# Patient Record
Sex: Female | Born: 1995 | Race: Black or African American | Hispanic: No | Marital: Single | Smoking: Never smoker
Health system: Southern US, Community
[De-identification: ages and names within clinical notes are randomized; demographics above are authoritative.]

---

## 2017-08-22 ENCOUNTER — Ambulatory Visit (INDEPENDENT_AMBULATORY_CARE_PROVIDER_SITE_OTHER): Payer: Managed Care, Other (non HMO) | Admitting: Certified Nurse Midwife

## 2017-08-22 ENCOUNTER — Encounter: Payer: Self-pay | Admitting: Certified Nurse Midwife

## 2017-08-22 VITALS — BP 127/70 | HR 75 | Ht 63.0 in | Wt 138.0 lb

## 2017-08-22 DIAGNOSIS — Z01419 Encounter for gynecological examination (general) (routine) without abnormal findings: Secondary | ICD-10-CM

## 2017-08-22 DIAGNOSIS — N898 Other specified noninflammatory disorders of vagina: Secondary | ICD-10-CM | POA: Diagnosis not present

## 2017-08-22 DIAGNOSIS — Z01411 Encounter for gynecological examination (general) (routine) with abnormal findings: Secondary | ICD-10-CM | POA: Diagnosis not present

## 2017-08-22 DIAGNOSIS — Z113 Encounter for screening for infections with a predominantly sexual mode of transmission: Secondary | ICD-10-CM

## 2017-08-22 DIAGNOSIS — T8384XA Pain from genitourinary prosthetic devices, implants and grafts, initial encounter: Secondary | ICD-10-CM | POA: Diagnosis not present

## 2017-08-22 NOTE — Patient Instructions (Signed)
Preventive Care 18-39 Years, Female Preventive care refers to lifestyle choices and visits with your health care provider that can promote health and wellness. What does preventive care include?  A yearly physical exam. This is also called an annual well check.  Dental exams once or twice a year.  Routine eye exams. Ask your health care provider how often you should have your eyes checked.  Personal lifestyle choices, including: ? Daily care of your teeth and gums. ? Regular physical activity. ? Eating a healthy diet. ? Avoiding tobacco and drug use. ? Limiting alcohol use. ? Practicing safe sex. ? Taking vitamin and mineral supplements as recommended by your health care provider. What happens during an annual well check? The services and screenings done by your health care provider during your annual well check will depend on your age, overall health, lifestyle risk factors, and family history of disease. Counseling Your health care provider may ask you questions about your:  Alcohol use.  Tobacco use.  Drug use.  Emotional well-being.  Home and relationship well-being.  Sexual activity.  Eating habits.  Work and work Statistician.  Method of birth control.  Menstrual cycle.  Pregnancy history.  Screening You may have the following tests or measurements:  Height, weight, and BMI.  Diabetes screening. This is done by checking your blood sugar (glucose) after you have not eaten for a while (fasting).  Blood pressure.  Lipid and cholesterol levels. These may be checked every 5 years starting at age 56.  Skin check.  Hepatitis C blood test.  Hepatitis B blood test.  Sexually transmitted disease (STD) testing.  BRCA-related cancer screening. This may be done if you have a family history of breast, ovarian, tubal, or peritoneal cancers.  Pelvic exam and Pap test. This may be done every 3 years starting at age 24. Starting at age 58, this may be done every 5  years if you have a Pap test in combination with an HPV test.  Discuss your test results, treatment options, and if necessary, the need for more tests with your health care provider. Vaccines Your health care provider may recommend certain vaccines, such as:  Influenza vaccine. This is recommended every year.  Tetanus, diphtheria, and acellular pertussis (Tdap, Td) vaccine. You may need a Td booster every 10 years.  Varicella vaccine. You may need this if you have not been vaccinated.  HPV vaccine. If you are 46 or younger, you may need three doses over 6 months.  Measles, mumps, and rubella (MMR) vaccine. You may need at least one dose of MMR. You may also need a second dose.  Pneumococcal 13-valent conjugate (PCV13) vaccine. You may need this if you have certain conditions and were not previously vaccinated.  Pneumococcal polysaccharide (PPSV23) vaccine. You may need one or two doses if you smoke cigarettes or if you have certain conditions.  Meningococcal vaccine. One dose is recommended if you are age 31-21 years and a first-year college student living in a residence hall, or if you have one of several medical conditions. You may also need additional booster doses.  Hepatitis A vaccine. You may need this if you have certain conditions or if you travel or work in places where you may be exposed to hepatitis A.  Hepatitis B vaccine. You may need this if you have certain conditions or if you travel or work in places where you may be exposed to hepatitis B.  Haemophilus influenzae type b (Hib) vaccine. You may need this if  you have certain risk factors.  Talk to your health care provider about which screenings and vaccines you need and how often you need them. This information is not intended to replace advice given to you by your health care provider. Make sure you discuss any questions you have with your health care provider. Document Released: 03/20/2001 Document Revised: 10/12/2015  Document Reviewed: 11/23/2014 Elsevier Interactive Patient Education  [employer].

## 2017-08-22 NOTE — Progress Notes (Signed)
New pt is here for an annual exam. LPS 2018. Skyla inserted 2016 and states she is having complications from it.

## 2017-08-23 ENCOUNTER — Other Ambulatory Visit: Payer: Self-pay | Admitting: Certified Nurse Midwife

## 2017-08-23 DIAGNOSIS — Z30431 Encounter for routine checking of intrauterine contraceptive device: Secondary | ICD-10-CM

## 2017-08-24 NOTE — Progress Notes (Signed)
ANNUAL PREVENTATIVE CARE GYN  ENCOUNTER NOTE  Subjective:       Martha Bates is a 22 y.o. G0P0000 female here for a routine annual gynecologic exam.  Current complaints: 1. Abdominal cramping with IUD-considerating removal 2. Vaginal dryness 3. Desires STI testing  Reports abdominal cramping with IUD for the last year. IUD was previously repositioned by previous provider. Patient states she is not always able to feel strings. Noticed intermittent vaginal dryness since IUD placement, but was not an issue at that time. In a new relationship, sexual active. Desires STI testing.   Denies difficulty breathing or respiratory distress, chest pain, abdominal pain, vaginal bleeding, dysuria, and leg pain or swelling.    Gynecologic History  Patient's last menstrual period was 08/07/2017 (exact date).  Contraception: IUD, Christean GriefSkyla 2016  Last Pap: 2018. Results were: normal  Obstetric History  OB History  Gravida Para Term Preterm AB Living  0 0 0 0 0 0  SAB TAB Ectopic Multiple Live Births  0 0 0 0 0    No past medical history on file.  Current Outpatient Medications on File Prior to Visit  Medication Sig Dispense Refill  . Levonorgestrel (SKYLA) 13.5 MG IUD by Intrauterine route.     No current facility-administered medications on file prior to visit.     No Known Allergies  Social History   Socioeconomic History  . Marital status: Single    Spouse name: Not on file  . Number of children: Not on file  . Years of education: Not on file  . Highest education level: Not on file  Occupational History  . Not on file  Social Needs  . Financial resource strain: Not on file  . Food insecurity:    Worry: Not on file    Inability: Not on file  . Transportation needs:    Medical: Not on file    Non-medical: Not on file  Tobacco Use  . Smoking status: Never Smoker  . Smokeless tobacco: Never Used  Substance and Sexual Activity  . Alcohol use: Yes    Comment: occas  . Drug  use: Never  . Sexual activity: Yes    Birth control/protection: IUD  Lifestyle  . Physical activity:    Days per week: Not on file    Minutes per session: Not on file  . Stress: Not on file  Relationships  . Social connections:    Talks on phone: Not on file    Gets together: Not on file    Attends religious service: Not on file    Active member of club or organization: Not on file    Attends meetings of clubs or organizations: Not on file    Relationship status: Not on file  . Intimate partner violence:    Fear of current or ex partner: Not on file    Emotionally abused: Not on file    Physically abused: Not on file    Forced sexual activity: Not on file  Other Topics Concern  . Not on file  Social History Narrative  . Not on file    Family History  Problem Relation Age of Onset  . Diabetes Mother     The following portions of the patient's history were reviewed and updated as appropriate: allergies, current medications, past family history, past medical history, past social history, past surgical history and problem list.  Review of Systems  ROS negative except as noted above. Information obtained from patient.    Objective:  BP 127/70   Pulse 75   Ht 5\' 3"  (1.6 m)   Wt 138 lb (62.6 kg)   LMP 08/07/2017 (Exact Date)   BMI 24.45 kg/m    CONSTITUTIONAL: Well-developed, well-nourished female in no acute distress.   PSYCHIATRIC: Normal mood and affect. Normal behavior. Normal judgment and thought content.  NEUROLGIC: Alert and oriented to person, place, and time. Normal muscle tone coordination. No cranial  nerve deficit noted.  HENT:  Normocephalic, atraumatic, External right and left ear normal.   EYES: Conjunctivae and EOM are normal. Pupils are equal and round.   NECK: Normal range of motion, supple, no masses.  Normal thyroid.   SKIN: Skin is warm and dry. No rash noted. Not diaphoretic. No erythema. No pallor.  CARDIOVASCULAR: Normal heart rate  noted, regular rhythm, no murmur.  RESPIRATORY: Clear to auscultation bilaterally. Effort and breath sounds normal, no problems with respiration noted.  BREASTS: Symmetric in size. No masses, skin changes, nipple drainage, or lymphadenopathy. Bilateral nipple rings present.   ABDOMEN: Soft, normal bowel sounds, no distention noted.  No tenderness, rebound or guarding.   PELVIC:  External Genitalia: Normal  Vagina: Normal  Cervix: Normal  Uterus: Normal  Adnexa: Normal  MUSCULOSKELETAL: Normal range of motion. No tenderness.  No cyanosis, clubbing, or edema.  2+ distal pulses.  LYMPHATIC: No Axillary, Supraclavicular, or Inguinal Adenopathy.  Assessment:   Annual gynecologic examination 22 y.o.   Contraception: Exercise/Diet/Weight control, Tobacco Warnings, Alcohol/Substance use risks, Stress Management, Peer Pressure Issues and Safe Sex   Normal BMI   Problem List Items Addressed This Visit    None    Visit Diagnoses    Well woman exam    -  Primary   Relevant Orders   NuSwab Vaginitis Plus (VG+)   Screening for STDs (sexually transmitted diseases)       Relevant Orders   NuSwab Vaginitis Plus (VG+)   Pain due to intrauterine contraceptive device (IUD), initial encounter [facility])          Plan:   IUD strings not present. Will schedule ultrasound to verify location and then remove per patient request  Pap: Not needed  Labs: NuSwab, will contact patient with results   Routine preventative health maintenance measures emphasized: Exercise/Diet/Weight control, Tobacco Warnings, Alcohol/Substance use risks, Stress Management, Peer Pressure Issues and Safe Sex; See AVS  Reviewed red flag symptoms and when to call  RTC ASAP for ultrasound to verify IUD placement and removal  RTC x 1 year for Annual Exam or sooner if needed   Gunnar Bulla, CNM [facility], [facility]

## 2017-08-26 LAB — NUSWAB VAGINITIS PLUS (VG+)
CANDIDA ALBICANS, NAA: POSITIVE — AB
CHLAMYDIA TRACHOMATIS, NAA: NEGATIVE
Candida glabrata, NAA: NEGATIVE
NEISSERIA GONORRHOEAE, NAA: NEGATIVE
Trich vag by NAA: NEGATIVE

## 2017-08-27 ENCOUNTER — Telehealth: Payer: Self-pay

## 2017-08-27 ENCOUNTER — Telehealth: Payer: Self-pay | Admitting: Certified Nurse Midwife

## 2017-08-27 NOTE — Progress Notes (Signed)
Please contact patient results. NuSwab positive for yeast infection. Review treatment options either diflucan (dispense 2, take one now and one if three days if symptoms continue, no refills) or terazo 7 vaginal cream nightly x 7 days. May have desired Rx. Encourage MyChart activation. Thanks, JML

## 2017-08-27 NOTE — Telephone Encounter (Signed)
The patient called and stated that she would like to speak with Jasmine DecemberSharon, Please advise.

## 2017-08-27 NOTE — Telephone Encounter (Signed)
Message left for pt to please return my call. 

## 2017-08-28 ENCOUNTER — Other Ambulatory Visit: Payer: Self-pay

## 2017-08-28 MED ORDER — FLUCONAZOLE 150 MG PO TABS: 150.0000 mg | ORAL_TABLET | Freq: Once | ORAL | 0 refills | Status: AC

## 2017-08-28 NOTE — Telephone Encounter (Signed)
The patient called back this morning and is requesting Jasmine DecemberSharon to call her back at her contact number.  Please advise, thanks.

## 2017-08-28 NOTE — Telephone Encounter (Signed)
Returned pts call- informed her of test results per ML. Pt chose [facility]lucan [facility] [place]

## 2017-08-30 ENCOUNTER — Ambulatory Visit (INDEPENDENT_AMBULATORY_CARE_PROVIDER_SITE_OTHER): Payer: Managed Care, Other (non HMO)

## 2017-08-30 DIAGNOSIS — Z30431 Encounter for routine checking of intrauterine contraceptive device: Secondary | ICD-10-CM | POA: Diagnosis not present

## 2017-09-05 ENCOUNTER — Ambulatory Visit: Payer: Managed Care, Other (non HMO) | Admitting: Certified Nurse Midwife

## 2017-09-13 ENCOUNTER — Encounter: Payer: Self-pay | Admitting: Certified Nurse Midwife

## 2017-09-13 ENCOUNTER — Ambulatory Visit (INDEPENDENT_AMBULATORY_CARE_PROVIDER_SITE_OTHER): Payer: Managed Care, Other (non HMO) | Admitting: Certified Nurse Midwife

## 2017-09-13 VITALS — BP 130/80 | HR 69 | Ht 61.0 in | Wt 137.3 lb

## 2017-09-13 DIAGNOSIS — Z30432 Encounter for removal of intrauterine contraceptive device: Secondary | ICD-10-CM | POA: Diagnosis not present

## 2017-09-13 MED ORDER — ETONOGESTREL-ETHINYL ESTRADIOL 0.12-0.015 MG/24HR VA RING: VAGINAL_RING | VAGINAL | 4 refills | Status: DC

## 2017-09-13 NOTE — Patient Instructions (Signed)
Ethinyl Estradiol; Etonogestrel vaginal ring What is this medicine? ETHINYL ESTRADIOL; ETONOGESTREL (ETH in il es tra DYE ole; et oh noe JES trel) vaginal ring is a flexible, vaginal ring used as a contraceptive (birth control method). This medicine combines two types of female hormones, an estrogen and a progestin. This ring is used to prevent ovulation and pregnancy. Each ring is effective for one month. This medicine may be used for other purposes; ask your health care provider or pharmacist if you have questions. COMMON BRAND NAME(S): NuvaRing What should I tell my health care provider before I take this medicine? They need to know if you have or ever had any of these conditions: -abnormal vaginal bleeding -blood vessel disease or blood clots -breast, cervical, endometrial, ovarian, liver, or uterine cancer -diabetes -gallbladder disease -heart disease or recent heart attack -high blood pressure -high cholesterol -kidney disease -liver disease -migraine headaches -stroke -systemic lupus erythematosus (SLE) -tobacco smoker -an unusual or allergic reaction to estrogens, progestins, other medicines, foods, dyes, or preservatives -pregnant or trying to get pregnant -breast-feeding How should I use this medicine? Insert the ring into your vagina as directed. Follow the directions on the prescription label. The ring will remain place for 3 weeks and is then removed for a 1-week break. A new ring is inserted 1 week after the last ring was removed, on the same day of the week. Check often to make sure the ring is still in place, especially before and after sexual intercourse. If the ring was out of the vagina for an unknown amount of time, you may not be protected from pregnancy. Perform a pregnancy test and call your doctor. Do not use more often than directed. A patient package insert for the product will be given with each prescription and refill. Read this sheet carefully each time. The  sheet may change frequently. Contact your pediatrician regarding the use of this medicine in children. Special care may be needed. This medicine has been used in female children who have started having menstrual periods. Overdosage: If you think you have taken too much of this medicine contact a poison control center or emergency room at once. NOTE: This medicine is only for you. Do not share this medicine with others. What if I miss a dose? You will need to replace your vaginal ring once a month as directed. If the ring should slip out, or if you leave it in longer or shorter than you should, contact your health care professional for advice. What may interact with this medicine? Do not take this medicine with the following medication: -dasabuvir; ombitasvir; paritaprevir; ritonavir -ombitasvir; paritaprevir; ritonavir This medicine may also interact with the following medications: -acetaminophen -antibiotics or medicines for infections, especially rifampin, rifabutin, rifapentine, and griseofulvin, and possibly penicillins or tetracyclines -aprepitant -ascorbic acid (vitamin C) -atorvastatin -barbiturate medicines, such as phenobarbital -bosentan -carbamazepine -caffeine -clofibrate -cyclosporine -dantrolene -doxercalciferol -felbamate -grapefruit juice -hydrocortisone -medicines for anxiety or sleeping problems, such as diazepam or temazepam -medicines for diabetes, including pioglitazone -modafinil -mycophenolate -nefazodone -oxcarbazepine -phenytoin -prednisolone -ritonavir or other medicines for HIV infection or AIDS -rosuvastatin -selegiline -soy isoflavones supplements -St. John's wort -tamoxifen or raloxifene -theophylline -thyroid hormones -topiramate -warfarin This list may not describe all possible interactions. Give your health care provider a list of all the medicines, herbs, non-prescription drugs, or dietary supplements you use. Also tell them if you smoke,  drink alcohol, or use illegal drugs. Some items may interact with your medicine. What should I watch for while using   this medicine? Visit your doctor or health care professional for regular checks on your progress. You will need a regular breast and pelvic exam and Pap smear while on this medicine. Use an additional method of contraception during the first cycle that you use this ring. Do not use a diaphragm or female condom, as the ring can interfere with these birth control methods and their proper placement. If you have any reason to think you are pregnant, stop using this medicine right away and contact your doctor or health care professional. If you are using this medicine for hormone related problems, it may take several cycles of use to see improvement in your condition. Smoking increases the risk of getting a blood clot or having a stroke while you are using hormonal birth control, especially if you are more than 23 years old. You are strongly advised not to smoke. This medicine can make your body retain fluid, making your fingers, hands, or ankles swell. Your blood pressure can go up. Contact your doctor or health care professional if you feel you are retaining fluid. This medicine can make you more sensitive to the sun. Keep out of the sun. If you cannot avoid being in the sun, wear protective clothing and use sunscreen. Do not use sun lamps or tanning beds/booths. If you wear contact lenses and notice visual changes, or if the lenses begin to feel uncomfortable, consult your eye care specialist. In some women, tenderness, swelling, or minor bleeding of the gums may occur. Notify your dentist if this happens. Brushing and flossing your teeth regularly may help limit this. See your dentist regularly and inform your dentist of the medicines you are taking. If you are going to have elective surgery, you may need to stop using this medicine before the surgery. Consult your health care professional  for advice. This medicine does not protect you against HIV infection (AIDS) or any other sexually transmitted diseases. What side effects may I notice from receiving this medicine? Side effects that you should report to your doctor or health care professional as soon as possible: -breast tissue changes or discharge -changes in vaginal bleeding during your period or between your periods -chest pain -coughing up blood -dizziness or fainting spells -headaches or migraines -leg, arm or groin pain -severe or sudden headaches -stomach pain (severe) -sudden shortness of breath -sudden loss of coordination, especially on one side of the body -speech problems -symptoms of vaginal infection like itching, irritation or unusual discharge -tenderness in the upper abdomen -vomiting -weakness or numbness in the arms or legs, especially on one side of the body -yellowing of the eyes or skin Side effects that usually do not require medical attention (report to your doctor or health care professional if they continue or are bothersome): -breakthrough bleeding and spotting that continues beyond the 3 initial cycles of pills -breast tenderness -mood changes, anxiety, depression, frustration, anger, or emotional outbursts -increased sensitivity to sun or ultraviolet light -nausea -skin rash, acne, or brown spots on the skin -weight gain (slight) This list may not describe all possible side effects. Call your doctor for medical advice about side effects. You may report side effects to FDA at 1-800-FDA-1088. Where should I keep my medicine? Keep out of the reach of children. Store at room temperature between 15 and 30 degrees C (59 and 86 degrees F) for up to 4 months. The product will expire after 4 months. Protect from light. Throw away any unused medicine after the expiration date. NOTE: This   sheet is a summary. It may not cover all possible information. If you have questions about this medicine, talk  to your doctor, pharmacist, or health care provider.  2018 Elsevier/Gold Standard (2015-09-30 17:00:31) Preventive Care 18-39 Years, Female Preventive care refers to lifestyle choices and visits with your health care provider that can promote health and wellness. What does preventive care include?  A yearly physical exam. This is also called an annual well check.  Dental exams once or twice a year.  Routine eye exams. Ask your health care provider how often you should have your eyes checked.  Personal lifestyle choices, including: ? Daily care of your teeth and gums. ? Regular physical activity. ? Eating a healthy diet. ? Avoiding tobacco and drug use. ? Limiting alcohol use. ? Practicing safe sex. ? Taking vitamin and mineral supplements as recommended by your health care provider. What happens during an annual well check? The services and screenings done by your health care provider during your annual well check will depend on your age, overall health, lifestyle risk factors, and family history of disease. Counseling Your health care provider may ask you questions about your:  Alcohol use.  Tobacco use.  Drug use.  Emotional well-being.  Home and relationship well-being.  Sexual activity.  Eating habits.  Work and work Statistician.  Method of birth control.  Menstrual cycle.  Pregnancy history.  Screening You may have the following tests or measurements:  Height, weight, and BMI.  Diabetes screening. This is done by checking your blood sugar (glucose) after you have not eaten for a while (fasting).  Blood pressure.  Lipid and cholesterol levels. These may be checked every 5 years starting at age 43.  Skin check.  Hepatitis C blood test.  Hepatitis B blood test.  Sexually transmitted disease (STD) testing.  BRCA-related cancer screening. This may be done if you have a family history of breast, ovarian, tubal, or peritoneal cancers.  Pelvic exam  and Pap test. This may be done every 3 years starting at age 46. Starting at age 37, this may be done every 5 years if you have a Pap test in combination with an HPV test.  Discuss your test results, treatment options, and if necessary, the need for more tests with your health care provider. Vaccines Your health care provider may recommend certain vaccines, such as:  Influenza vaccine. This is recommended every year.  Tetanus, diphtheria, and acellular pertussis (Tdap, Td) vaccine. You may need a Td booster every 10 years.  Varicella vaccine. You may need this if you have not been vaccinated.  HPV vaccine. If you are 11 or younger, you may need three doses over 6 months.  Measles, mumps, and rubella (MMR) vaccine. You may need at least one dose of MMR. You may also need a second dose.  Pneumococcal 13-valent conjugate (PCV13) vaccine. You may need this if you have certain conditions and were not previously vaccinated.  Pneumococcal polysaccharide (PPSV23) vaccine. You may need one or two doses if you smoke cigarettes or if you have certain conditions.  Meningococcal vaccine. One dose is recommended if you are age 28-21 years and a first-year college student living in a residence hall, or if you have one of several medical conditions. You may also need additional booster doses.  Hepatitis A vaccine. You may need this if you have certain conditions or if you travel or work in places where you may be exposed to hepatitis A.  Hepatitis B vaccine. You may need  this if you have certain conditions or if you travel or work in places where you may be exposed to hepatitis B.  Haemophilus influenzae type b (Hib) vaccine. You may need this if you have certain risk factors.  Talk to your health care provider about which screenings and vaccines you need and how often you need them. This information is not intended to replace advice given to you by your health care provider. Make sure you discuss any  questions you have with your health care provider. Document Released: 03/20/2001 Document Revised: 10/12/2015 Document Reviewed: 11/23/2014 Elsevier Interactive Patient Education  [employer].

## 2017-09-15 NOTE — Progress Notes (Signed)
Autumn MessingKristin Bates is a 22 y.o. year old G0P0000 African American female who presents for removal of a Skyla IUD. Her Skyla IUD was placed in 2016.   BP 130/80   Pulse 69   Ht 5\' 1"  (1.549 m)   Wt 137 lb 4.8 oz (62.3 kg)   LMP 08/19/2017   BMI 25.94 kg/m   Time out was performed.  A small plastic speculum was placed in the vagina.  The cervix was visualized, and the strings were NOT visible. An abdominal ultrasound was performed to verify IUD location. Attempts to remove device with IUD hook were unsuccessful. Via ultrasound guidance the stings were grasped and the Skyla was easily removed intact without complications by Harlow MaresMelody Shambley, CNM.   Rx: NuvaRing, see orders. Samples given.   Reviewed red flag symptoms and when to call.   RTC as needed.    Gunnar BullaJenkins Michelle Evelyn Moch, CNM [facility], [facility]

## 2017-10-13 ENCOUNTER — Encounter: Payer: Self-pay | Admitting: Emergency Medicine

## 2017-10-13 ENCOUNTER — Ambulatory Visit (INDEPENDENT_AMBULATORY_CARE_PROVIDER_SITE_OTHER)
Admission: EM | Admit: 2017-10-13 | Discharge: 2017-10-13 | Disposition: A | Discharge status: Nursing Home (Includes ICF) | Payer: Managed Care, Other (non HMO) | Source: Home / Self Care

## 2017-10-13 ENCOUNTER — Emergency Department: Payer: Managed Care, Other (non HMO)

## 2017-10-13 ENCOUNTER — Emergency Department
Admission: EM | Admit: 2017-10-13 | Discharge: 2017-10-13 | Disposition: A | Discharge status: Home / Self Care | Payer: Managed Care, Other (non HMO) | Attending: Emergency Medicine | Admitting: Emergency Medicine

## 2017-10-13 DIAGNOSIS — G4489 Other headache syndrome: Secondary | ICD-10-CM

## 2017-10-13 DIAGNOSIS — Q670 Congenital facial asymmetry: Secondary | ICD-10-CM

## 2017-10-13 DIAGNOSIS — R51 Headache: Secondary | ICD-10-CM | POA: Diagnosis present

## 2017-10-13 DIAGNOSIS — M79602 Pain in left arm: Secondary | ICD-10-CM | POA: Diagnosis not present

## 2017-10-13 DIAGNOSIS — R29810 Facial weakness: Secondary | ICD-10-CM | POA: Insufficient documentation

## 2017-10-13 LAB — COMPREHENSIVE METABOLIC PANEL
ALT: 15 U/L (ref 0–44)
AST: 24 U/L (ref 15–41)
Albumin: 4.6 g/dL (ref 3.5–5.0)
Alkaline Phosphatase: 51 U/L (ref 38–126)
Anion gap: 6 (ref 5–15)
BILIRUBIN TOTAL: 0.8 mg/dL (ref 0.3–1.2)
BUN: 10 mg/dL (ref 6–20)
CHLORIDE: 104 mmol/L (ref 98–111)
CO2: 28 mmol/L (ref 22–32)
CREATININE: 0.67 mg/dL (ref 0.44–1.00)
Calcium: 9.6 mg/dL (ref 8.9–10.3)
GFR calc Af Amer: >60 mL/min (ref >60)
GFR calc non Af Amer: >60 mL/min (ref >60)
Glucose, Bld: 107 mg/dL — ABNORMAL HIGH (ref 70–99)
Potassium: 3.5 mmol/L (ref 3.5–5.1)
Sodium: 138 mmol/L (ref 135–145)
TOTAL PROTEIN: 7.9 g/dL (ref 6.5–8.1)

## 2017-10-13 LAB — TROPONIN I

## 2017-10-13 LAB — DIFFERENTIAL
BASOS ABS: 0 10*3/uL (ref 0–0.1)
Basophils Relative: 0 %
Eosinophils Absolute: 0.1 10*3/uL (ref 0–0.7)
Eosinophils Relative: 1 %
LYMPHS ABS: 1.8 10*3/uL (ref 1.0–3.6)
Lymphocytes Relative: 27 %
MONOS PCT: 6 %
Monocytes Absolute: 0.4 10*3/uL (ref 0.2–0.9)
NEUTROS ABS: 4.4 10*3/uL (ref 1.4–6.5)
Neutrophils Relative %: 66 %

## 2017-10-13 LAB — CBC
HEMATOCRIT: 38.9 % (ref 35.0–47.0)
HEMOGLOBIN: 12.7 g/dL (ref 12.0–16.0)
MCH: 24.7 pg — ABNORMAL LOW (ref 26.0–34.0)
MCHC: 32.8 g/dL (ref 32.0–36.0)
MCV: 75.2 fL — ABNORMAL LOW (ref 80.0–100.0)
Platelets: 358 10*3/uL (ref 150–440)
RBC: 5.17 MIL/uL (ref 3.80–5.20)
RDW: 15.2 % — ABNORMAL HIGH (ref 11.5–14.5)
WBC: 6.7 10*3/uL (ref 3.6–11.0)

## 2017-10-13 LAB — PROTIME-INR
INR: 0.99
Prothrombin Time: 13 seconds (ref 11.4–15.2)

## 2017-10-13 LAB — APTT: aPTT: 28 seconds (ref 24–36)

## 2017-10-13 LAB — POCT PREGNANCY, URINE: Preg Test, Ur: NEGATIVE

## 2017-10-13 MED ORDER — SUMATRIPTAN SUCCINATE 50 MG PO TABS: 50.0000 mg | ORAL_TABLET | Freq: Once | ORAL | 0 refills | Status: DC | PRN

## 2017-10-13 MED ORDER — PROCHLORPERAZINE EDISYLATE 10 MG/2ML IJ SOLN
10.0000 mg | Freq: Once | INTRAMUSCULAR | Status: AC
  Administered 2017-10-13: 10 mg via INTRAVENOUS
  Filled 2017-10-13: qty 2

## 2017-10-13 MED ORDER — SODIUM CHLORIDE 0.9 % IV BOLUS
1000.0000 mL | Freq: Once | INTRAVENOUS | Status: AC
  Administered 2017-10-13: 1000 mL via INTRAVENOUS

## 2017-10-13 NOTE — ED Triage Notes (Signed)
Patient reports 2-3 headaches per weeks since April.  Patient states current headache started yesterday.  Patient reports blurry vision in her right eye.  Patient states she has had this symptom with headaches previously.  Patient also reports intermittent episodes or arm pain and numbness, particularly on her left side.  Patient also reports a couple of transient episodes of right sided facial droop and foot swelling.  Patient came to the ED today from [facility] due to concern regarding possible TIAs.  Patient's smile is even at this time.

## 2017-10-13 NOTE — ED Notes (Signed)
ED Provider at bedside. 

## 2017-10-13 NOTE — Discharge Instructions (Addendum)
It is not clear exactly why you are having these headaches we are reassured by your CT scan but we do strongly advise you follow-up with a neurologist in the next few days.  Return to the emergency room for any numbness or weakness or fever, change in vision or hearing difficulty speaking or other atypical symptoms.  It is very important that he follow closely with neurology.  I will send you home with Imitrex which sometimes helps people who have migraines which I suspect he may have, however, her work-up here is not enough to determine the definitively have migraines and we strongly advised that you follow-up with neurology for definitive diagnosis

## 2017-10-13 NOTE — ED Notes (Signed)
Pt ambulatory to toilet to collect urine sample.  

## 2017-10-13 NOTE — ED Provider Notes (Addendum)
[facility] Emergency Department Provider Note  ____________________________________________   I have reviewed the triage vital signs and the nursing notes. Where available I have reviewed prior notes and, if possible and indicated, outside hospital notes.    HISTORY  Chief Complaint Headache    HPI Martha Bates is a 22 y.o. female  Who is healthy, states she has had headaches off and on her whole life mostly associated with her.,  She states over the last 6 months or so, since early April, she is been having more frequent headaches to the point where she has them 3 times a week.  She denies any head trauma she denies pregnancy, she is not on any, birth control, she has no personal or family history of aneurysm, she denies illicit drug use.  Is over the last 3 weeks to a month she has been having unusual neurologic symptoms associated with this.  She states she has had some pain that goes from her pinky finger up towards her shoulder on the left side sometimes, but no weakness, she is not having that right now that she had a briefly earlier today.  She states also she has had episodes of a drooping feeling on her face just around her mouth.  She states that been coming going associated with the headaches over the last several weeks.  It was earlier today but now is resolved.  She denies any stiff neck or fever.  No hoarse voice no chest pain no exertional symptoms, patient does not use any significant caffeine.  She does work third shift overnight, she states no one else with him she works as having similar symptoms. Headaches, when they come, get a Toothaker bit of relief with naproxen or Motrin and then when she sleeps usually they go away sometimes they last for a couple days.  Nothing else makes it better or worse.  She is not actually taking any birth control anymore.  She does not use the NuvaRing.  She is sexually active denies abuse, patient denies any tickborne  exposures or recent travel.  No known carbon monoxide exposure is mostly this starts at work as she stays up all night working it [employer].  However none of her colleagues have had similar symptoms headaches are generalized usually start in the front go towards the back, sometimes a throbbing, they are associated with photophobia without vision change, and sometimes nausea.    History reviewed. No pertinent past medical history.  There are no active problems to display for this patient.   History reviewed. No pertinent surgical history.  Meds: None Allergies: Latex  Family History  Problem Relation Age of Onset  . Diabetes Mother     Social History Social History   Tobacco Use  . Smoking status: Never Smoker  . Smokeless tobacco: Never Used  Substance Use Topics  . Alcohol use: Yes    Comment: occas  . Drug use: Never    Review of Systems Constitutional: No fever/chills Eyes: No visual changes. ENT: No sore throat. No stiff neck no neck pain Cardiovascular: Denies chest pain. Respiratory: Denies shortness of breath. Gastrointestinal:   no vomiting.  No diarrhea.  No constipation. Genitourinary: Negative for dysuria. Musculoskeletal: Negative lower extremity swelling Skin: Negative for rash. Neurological: Negative for severe headaches, focal weakness or numbness.   ____________________________________________   PHYSICAL EXAM:  VITAL SIGNS: ED Triage Vitals [10/13/17 1558]  Enc Vitals Group     BP (!) 143/82     Pulse  Rate 69     Resp 16     Temp 98.7 F (37.1 C)     Temp Source Oral     SpO2 100 %     Weight 142 lb (64.4 kg)     Height 5\' 1"  (1.549 m)     Head Circumference      Peak Flow      Pain Score 5     Pain Loc      Pain Edu?      Excl. in GC?     Constitutional: Alert and oriented. Well appearing and in no acute distress. Eyes: Conjunctivae are normal Head: Atraumatic HEENT: No congestion/rhinnorhea. Mucous membranes are moist.   Oropharynx non-erythematous Neck:   Nontender with no meningismus, no masses, no stridor Cardiovascular: Normal rate, regular rhythm. Grossly normal heart sounds.  Good peripheral circulation. Respiratory: Normal respiratory effort.  No retractions. Lungs CTAB. Abdominal: Soft and nontender. No distention. No guarding no rebound Back:  There is no focal tenderness or step off.  there is no midline tenderness there are no lesions noted. there is no CVA tenderness Musculoskeletal: No lower extremity tenderness, no upper extremity tenderness. No joint effusions, no DVT signs strong distal pulses no edema Neurologic: Cranial nerves II through XII are grossly intact 5 out of 5 strength bilateral upper and lower extremity. Finger to nose within normal limits heel to shin within normal limits, speech is normal with no word finding difficulty or dysarthria, reflexes symmetric, pupils are equally round and reactive to light, there is no pronator drift, sensation is normal, vision is intact to confrontation, gait is deferred, there is no nystagmus, normal neurologic exam Skin:  Skin is warm, dry and intact. No rash noted. Psychiatric: Mood and affect are normal. Speech and behavior are normal.  ____________________________________________   LABS (all labs ordered are listed, but only abnormal results are displayed)  Labs Reviewed  CBC - Abnormal; Notable for the following components:      Result Value   MCV 75.2 (*)    MCH 24.7 (*)    RDW 15.2 (*)    All other components within normal limits  COMPREHENSIVE METABOLIC PANEL - Abnormal; Notable for the following components:   Glucose, Bld 107 (*)    All other components within normal limits  PROTIME-INR  APTT  DIFFERENTIAL  TROPONIN I  POC URINE PREG, ED  POCT PREGNANCY, URINE    Pertinent labs  results that were available during my care of the patient were reviewed by me and considered in my medical decision making (see chart for  details). ____________________________________________  EKG  I personally interpreted any EKGs ordered by me or triage Normal EKG, sinus rhythm rate 67 bpm no acute ST elevation or depression normal axis ____________________________________________  RADIOLOGY  Pertinent labs & imaging results that were available during my care of the patient were reviewed by me and considered in my medical decision making (see chart for details). If possible, patient and/or family made aware of any abnormal findings.  No results found. ____________________________________________    PROCEDURES  Procedure(s) performed: None  Procedures  Critical Care performed: None  ____________________________________________   INITIAL IMPRESSION / ASSESSMENT AND PLAN / ED COURSE  Pertinent labs & imaging results that were available during my care of the patient were reviewed by me and considered in my medical decision making (see chart for details).  With chronic recurrent headaches.  I will give her medication for the headache she has at this time.  She states this is mild.  Neurologic exam is quite reassuring.  Given her history however of headache associate with neurologic complaints I will obtain a CT as a screening test.  I have informed the patient that she most likely need an outpatient MRI if that is negative.  Given chronicity of complaint I have very low suspicion for acute process.  Specifically, there is nothing at this time to suggest CVA mass bleed or meningitis however, we will certainly image her as a precaution.  Given the nature of her headaches I do feel that she will require close follow-up.  Given the chronicity of her headaches I do not think she requires admission.  ----------------------------------------- 7:09 PM on 10/13/2017 -----------------------------------------  Patient states her headache is nearly gone and she feels much better.  Able to sleep here.  CT reassuring blood work  reassuring.   Pt remains at neurologic baseline. At this time, there is nothing to suggest or support the diagnosis of subarachnoid hemorrhage, aneurysmal event, meningitis, tumor or mass, cavernous thrombosis, encephalitis, ischemic stroke, pseudotumor cerebri, glaucoma, temporal arteritis, or any other acute intracrania/neurological process. Extensive return precautions including but not limited to any new or worrisome symptoms such as worsening of, or change in, headache, any neurological symptoms, fever etc.. Natural disease course discussed with patient. The need for follow-up and all of my customary return precautions have been discussed as well.    ____________________________________________   FINAL CLINICAL IMPRESSION(S) / ED DIAGNOSES  Final diagnoses:  None      This chart was dictated using voice recognition software.  Despite best efforts to proofread,  errors can occur which can change meaning.       Jeanmarie Plant, MD 10/13/17 Bennie Hind    Jeanmarie Plant, MD 10/13/17 [phone]

## 2017-10-13 NOTE — ED Notes (Signed)
Pt transported to CT ?

## 2017-10-13 NOTE — ED Triage Notes (Signed)
For the past few weeks see has been having headache with vision changes, feet swelling, numbness and tingling radiating up her arm and said a few days ago she was having facial numbness on the right side along with dropping. Right eye blurry currently with her headache and right thumb twitching.

## 2017-10-13 NOTE — ED Provider Notes (Signed)
[facility]    CSN: [id number] Arrival date & time: 10/13/17  1339     History   Chief Complaint Chief Complaint  Patient presents with  . Headache    HPI Rosaria Kubin is a 22 y.o. female.   HPI  22 year old female presents stating that she is had headaches beginning in April.  She has experienced a visual changes with the headaches since that time she has noticed that the headaches have progressed in frequency and severity.  She states a few days ago she was having facial numbness on the right side of her face with drooping of the right side of her face.  Had numbness and tingling radiating up her arm with feet swelling.  Has not been evaluated by anyone so far.  Currently she has a headache with right eye blurriness.  She also has some twitching of her right thumb.  Pressures 142/92.  During employee health check at [employer] she had a blood pressure of 128/90.  Not taking birth control pills had an IUD for 4 years which was removed and she is on a nova ring but has not been using it recently.  Alert and oriented and in no distress at the present time         History reviewed. No pertinent past medical history.  There are no active problems to display for this patient.   History reviewed. No pertinent surgical history.  OB History    Gravida  0   Para  0   Term  0   Preterm  0   AB  0   Living  0     SAB  0   TAB  0   Ectopic  0   Multiple  0   Live Births  0            Home Medications    Prior to Admission medications   Medication Sig Start Date End Date Taking? Authorizing Provider  etonogestrel-ethinyl estradiol (NUVARING) 0.12-0.015 MG/24HR vaginal ring Insert vaginally and leave in place for 3 consecutive weeks, then remove for 1 week. 09/13/17  Yes Lawhorn, Vanessa Inwood, CNM  Levonorgestrel (SKYLA) 13.5 MG IUD by Intrauterine route.    [provider]    Family History Family History  Problem Relation Age of  Onset  . Diabetes Mother     Social History Social History   Tobacco Use  . Smoking status: Never Smoker  . Smokeless tobacco: Never Used  Substance Use Topics  . Alcohol use: Yes    Comment: occas  . Drug use: Never     Allergies   Latex   Review of Systems Review of Systems  Constitutional: Positive for activity change. Negative for appetite change, chills, fatigue and fever.  Neurological: Positive for tremors, facial asymmetry, weakness, numbness and headaches.  All other systems reviewed and are negative.    Physical Exam Triage Vital Signs ED Triage Vitals  Enc Vitals Group     BP 10/13/17 1407 (!) 142/92     Pulse Rate 10/13/17 1407 96     Resp 10/13/17 1407 18     Temp 10/13/17 1407 98.6 F (37 C)     Temp Source 10/13/17 1407 Oral     SpO2 10/13/17 1407 100 %     Weight 10/13/17 1409 142 lb (64.4 kg)     Height --      Head Circumference --      Peak Flow --  Pain Score 10/13/17 1409 5     Pain Loc --      Pain Edu? --      Excl. in GC? --    No data found.  Updated Vital Signs BP (!) 142/92   Pulse 96   Temp 98.6 F (37 C) (Oral)   Resp 18   Wt 142 lb (64.4 kg)   LMP 10/10/2017 (Exact Date) Comment: nuvaring  SpO2 100%   BMI 26.83 kg/m   Visual Acuity Right Eye Distance:   Left Eye Distance:   Bilateral Distance:    Right Eye Near:   Left Eye Near:    Bilateral Near:     Physical Exam  Constitutional: She is oriented to person, place, and time. She appears well-developed and well-nourished.  Non-toxic appearance. She does not appear ill. No distress.  HENT:  Head: Normocephalic.  Musculoskeletal: Normal range of motion.  Neurological: She is alert and oriented to person, place, and time.  Skin: Skin is warm and dry.  Psychiatric: She has a normal mood and affect. Her behavior is normal.  Nursing note and vitals reviewed.    UC Treatments / Results  Labs (all labs ordered are listed, but only abnormal results are  displayed) Labs Reviewed - No data to display  EKG None  Radiology No results found.  Procedures Procedures (including critical care time)  Medications Ordered in UC Medications - No data to display  Initial Impression / Assessment and Plan / UC Course  I have reviewed the triage vital signs and the nursing notes.  Pertinent labs & imaging results that were available during my care of the patient were reviewed by me and considered in my medical decision making (see chart for details).     Discussed with the patient her symptoms and my concerns for the recent worsening of  the facial numbness with drooping and the numbness in her right upper extremity.  I told her she would need to have a higher level of care than what I can provide here.  To have more thorough investigation.  Recommended she go to the emergency room today.  He agreed and she is going to go [facility].  She is in stable condition at the present time and will drive herself.  Spoke with Tobi Bastos the triage nurse to alert her of the patient's condition and her arrival Final Clinical Impressions(s) / UC Diagnoses   Final diagnoses:  Other headache syndrome  Facial asymmetry   Discharge Instructions   None    ED Prescriptions    None     Controlled Substance Prescriptions [place] Controlled Substance Registry consulted? Not Applicable   Lutricia Feil, PA-C 10/13/17 1694

## 2017-10-23 DIAGNOSIS — G43009 Migraine without aura, not intractable, without status migrainosus: Secondary | ICD-10-CM | POA: Insufficient documentation

## 2018-03-12 ENCOUNTER — Encounter: Payer: Self-pay | Admitting: Emergency Medicine

## 2018-03-12 ENCOUNTER — Ambulatory Visit
Admission: EM | Admit: 2018-03-12 | Discharge: 2018-03-12 | Disposition: A | Discharge status: Home / Self Care | Payer: Managed Care, Other (non HMO) | Attending: Family Medicine | Admitting: Family Medicine

## 2018-03-12 ENCOUNTER — Other Ambulatory Visit: Payer: Self-pay

## 2018-03-12 DIAGNOSIS — B9789 Other viral agents as the cause of diseases classified elsewhere: Secondary | ICD-10-CM | POA: Diagnosis not present

## 2018-03-12 DIAGNOSIS — J069 Acute upper respiratory infection, unspecified: Secondary | ICD-10-CM | POA: Diagnosis not present

## 2018-03-12 LAB — RAPID STREP SCREEN (MED CTR MEBANE ONLY): Streptococcus, Group A Screen (Direct): NEGATIVE

## 2018-03-12 MED ORDER — HYDROCOD POLST-CPM POLST ER 10-8 MG/5ML PO SUER: 5.0000 mL | Freq: Two times a day (BID) | ORAL | 0 refills | Status: DC | PRN

## 2018-03-12 NOTE — Discharge Instructions (Signed)
Rest, fluids, tylenol/ibuprofen °

## 2018-03-12 NOTE — ED Triage Notes (Signed)
Patient c/o sore throat, headache, and bodyaches that started on Sunday.

## 2018-03-12 NOTE — ED Provider Notes (Signed)
[facility]    CSN: [id number] Arrival date & time: 03/12/18  1226     History   Chief Complaint Chief Complaint  Patient presents with  . Sore Throat    APPOINTMENT  . Generalized Body Aches  . Headache    HPI Martha Bates is a 23 y.o. female.   The history is provided by the patient.  Sore Throat   Headache  Associated symptoms: congestion, myalgias, sore throat and URI   URI  Presenting symptoms: congestion, rhinorrhea and sore throat   Onset quality:  Sudden Duration:  5 days Timing:  Constant Chronicity:  New Relieved by:  None tried Ineffective treatments:  None tried Associated symptoms: myalgias   Associated symptoms: no sinus pain and no wheezing   Risk factors: sick contacts     History reviewed. No pertinent past medical history.  There are no active problems to display for this patient.   History reviewed. No pertinent surgical history.  OB History    Gravida  0   Para  0   Term  0   Preterm  0   AB  0   Living  0     SAB  0   TAB  0   Ectopic  0   Multiple  0   Live Births  0            Home Medications    Prior to Admission medications   Medication Sig Start Date End Date Taking? Authorizing Provider  SUMAtriptan (IMITREX) 50 MG tablet Take 1 tablet (50 mg total) by mouth once as needed for migraine. May repeat in 2 hours if headache persists or recurs. 10/13/17 10/14/18 Yes McShane, Rudy Jew, MD  chlorpheniramine-HYDROcodone (TUSSIONEX PENNKINETIC ER) 10-8 MG/5ML SUER Take 5 mLs by mouth every 12 (twelve) hours as needed. 03/12/18   Payton Mccallum, MD  etonogestrel-ethinyl estradiol (NUVARING) 0.12-0.015 MG/24HR vaginal ring Insert vaginally and leave in place for 3 consecutive weeks, then remove for 1 week. 09/13/17   Gunnar Bulla, CNM  Levonorgestrel (SKYLA) 13.5 MG IUD by Intrauterine route.    [provider]    Family History Family History  Problem Relation Age of Onset  . Diabetes  Mother     Social History Social History   Tobacco Use  . Smoking status: Never Smoker  . Smokeless tobacco: Never Used  Substance Use Topics  . Alcohol use: Yes    Comment: occas  . Drug use: Never     Allergies   Latex   Review of Systems Review of Systems  HENT: Positive for congestion, rhinorrhea and sore throat. Negative for sinus pain.   Respiratory: Negative for wheezing.   Musculoskeletal: Positive for myalgias.     Physical Exam Triage Vital Signs ED Triage Vitals  Enc Vitals Group     BP 03/12/18 1252 113/85     Pulse Rate 03/12/18 1252 84     Resp 03/12/18 1252 14     Temp 03/12/18 1252 98.7 F (37.1 C)     Temp Source 03/12/18 1252 Oral     SpO2 03/12/18 1252 100 %     Weight 03/12/18 1249 140 lb (63.5 kg)     Height 03/12/18 1249 5\' 1"  (1.549 m)     Head Circumference --      Peak Flow --      Pain Score 03/12/18 1249 8     Pain Loc --      Pain Edu? --  Excl. in GC? --    No data found.  Updated Vital Signs BP 113/85 (BP Location: Left Arm)   Pulse 84   Temp 98.7 F (37.1 C) (Oral)   Resp 14   Ht 5\' 1"  (1.549 m)   Wt 63.5 kg   LMP 02/19/2018 (Approximate)   SpO2 100%   BMI 26.45 kg/m   Visual Acuity Right Eye Distance:   Left Eye Distance:   Bilateral Distance:    Right Eye Near:   Left Eye Near:    Bilateral Near:     Physical Exam Vitals signs and nursing note reviewed.  Constitutional:      General: She is not in acute distress.    Appearance: She is well-developed. She is not toxic-appearing or diaphoretic.  HENT:     Head: Normocephalic and atraumatic.     Right Ear: Tympanic membrane, ear canal and external ear normal.     Left Ear: Tympanic membrane, ear canal and external ear normal.     Nose: Rhinorrhea present.     Mouth/Throat:     Pharynx: Uvula midline. No oropharyngeal exudate.  Eyes:     General: No scleral icterus.       Right eye: No discharge.        Left eye: No discharge.  Neck:      Musculoskeletal: Normal range of motion and neck supple.     Thyroid: No thyromegaly.  Cardiovascular:     Rate and Rhythm: Normal rate and regular rhythm.     Heart sounds: Normal heart sounds.  Pulmonary:     Effort: Pulmonary effort is normal. No respiratory distress.     Breath sounds: Normal breath sounds. No stridor. No wheezing, rhonchi or rales.  Lymphadenopathy:     Cervical: No cervical adenopathy.      UC Treatments / Results  Labs (all labs ordered are listed, but only abnormal results are displayed) Labs Reviewed  RAPID STREP SCREEN ([facility] ONLY)  CULTURE, GROUP A STREP [facility])    EKG None  Radiology No results found.  Procedures Procedures (including critical care time)  Medications Ordered in UC Medications - No data to display  Initial Impression / Assessment and Plan / UC Course  I have reviewed the triage vital signs and the nursing notes.  Pertinent labs & imaging results that were available during my care of the patient were reviewed by me and considered in my medical decision making (see chart for details).      Final Clinical Impressions(s) / UC Diagnoses   Final diagnoses:  Viral URI with cough     Discharge Instructions     Rest, fluids, tylenol/ibuprofen    ED Prescriptions    Medication Sig Dispense Auth. Provider   chlorpheniramine-HYDROcodone (TUSSIONEX PENNKINETIC ER) 10-8 MG/5ML SUER Take 5 mLs by mouth every 12 (twelve) hours as needed. 60 mL Payton Mccallum, MD     1. Lab results and diagnosis reviewed with patient 2. rx as per orders above; reviewed possible side effects, interactions, risks and benefits  3. Recommend supportive treatment as above 4. Follow-up prn if symptoms worsen or don't improve Controlled Substance Prescriptions [place] Controlled Substance Registry consulted? Not Applicable   Payton Mccallum, MD 03/12/18 1332

## 2018-03-14 LAB — CULTURE, GROUP A STREP (THRC)

## 2018-05-20 ENCOUNTER — Telehealth: Payer: Self-pay | Admitting: Certified Nurse Midwife

## 2018-05-20 NOTE — Telephone Encounter (Signed)
LVM for pt to call office back and schedule tele-visit. Thank you.

## 2018-05-20 NOTE — Telephone Encounter (Signed)
The patient sent a MyChart message stating she has the following issues.   "Nipple leakage, breast, and chest pain. Switching birth control"  The patient did not disclose any other information, Would you like for me to schedule a tel-visit or an office appointment? Please advise.

## 2018-05-22 ENCOUNTER — Other Ambulatory Visit: Payer: Self-pay

## 2018-05-22 ENCOUNTER — Ambulatory Visit (INDEPENDENT_AMBULATORY_CARE_PROVIDER_SITE_OTHER): Payer: Managed Care, Other (non HMO) | Admitting: Certified Nurse Midwife

## 2018-05-22 DIAGNOSIS — N643 Galactorrhea not associated with childbirth: Secondary | ICD-10-CM

## 2018-05-22 MED ORDER — NORETHIN-ETH ESTRAD-FE BIPHAS 1 MG-10 MCG / 10 MCG PO TABS: 1.0000 | ORAL_TABLET | Freq: Every day | ORAL | 4 refills | Status: AC

## 2018-05-22 NOTE — Progress Notes (Signed)
Received transferred call from [facility]- Pt provided 2 identifiers. Pt is c/o nipple "leakage and tenderness." Pt is not using birth control. LMP 05/16/18. Call transferred to [facility]

## 2018-05-22 NOTE — Progress Notes (Signed)
Virtual Visit via Telephone Note  I connected with Martha Bates on 05/22/18 at 2:57 PM by telephone and verified that I am speaking with the correct person using two identifiers.   I discussed the limitations, risks, security and privacy concerns of performing an evaluation and management service by telephone and the availability of in person appointments. I also discussed with the patient that there may be a patient responsible charge related to this service. The patient expressed understanding and agreed to proceed.   History of Present Illness:  Reports leakage of nipple since 05/12/2018. Fluid is clear to cloudy whitish color; no blood. Started with right breast and moved to left breast after five (5) to six (6) days.   Stopped using NuvaRing months ago. Not using anything for pregnancy prevention. Wishes to restart Lo Loestrin.   LMP: 05/16/2018 Period Cycle (Days): 28 Period Duration (Days): Three (3) Period Pattern: Regular Menstrual Flow: Heavy Menstrual Control: Maxi pad, Tampon Menstrual Control Change Freq (Hours): Eight (8) to 12  Dysmenorrhea: (!) Severe Dysmenorrhea Symptoms: Cramping, Other (Comment)(Breast pain and reflux)-decreased with Aleve PO  Observations/Objective:  Denies difficulty breathing or respiratory distress, chest pain, abdominal pain, excessive vaginal bleeding, dysuria, and leg pain or swelling.   Assessment and Plan:  Galactorrhea-labs, see orders; education regarding home treatment options  Initiation OCP-Rx Lo Loestrin, see orders; advised to start after next menses  Follow Up Instructions:    I discussed the assessment and treatment plan with the patient. The patient was provided an opportunity to ask questions and all were answered. The patient agreed with the plan and demonstrated an understanding of the instructions.   The patient was advised to call back or seek an in-person evaluation if the symptoms worsen or if the condition fails  to improve as anticipated.  I provided 10 minutes of non-face-to-face time during this encounter.   Gunnar Bulla, CNM [facility], [facility]

## 2018-05-22 NOTE — Patient Instructions (Addendum)
Galactorrhea Galactorrhea is an abnormal milky discharge from the breast. The discharge may come from one or both nipples. The fluid is often white, yellow, or green. It is different from the normal milk produced in nursing mothers. Galactorrhea usually occurs in women, but it can sometimes affect men. Various things can cause galactorrhea, such as:  Irritation of the breast, which can result from injury, stimulation during sexual activity, or clothes rubbing against the nipple.  Medicines.  Changes in hormone levels. In many cases, galactorrhea will go away without treatment. However, galactorrhea can also be a sign of something more serious, such as diseases of the kidney or thyroid, or problems with the pituitary gland. Your health care provider may do various tests to help determine the cause. Sometimes the cause is unknown. It is important to monitor your condition to make sure that it goes away. Follow these instructions at home:  Breast care  Watch your condition for any changes.  Do not squeeze your breasts or nipples.  Avoid breast stimulation during sexual activity.  Perform a breast self-exam once a month. Doing this more often can irritate your breasts.  Avoid clothes that rub on your nipples.  Use breast pads to absorb the discharge.  Wear a breast binder or a support bra to help prevent clothes from rubbing on your nipples. General instructions  Take over-the-counter and prescription medicines only as told by your health care provider.  Keep all follow-up visits as told by your health care provider. This is important. Contact a health care provider if you:  Develop hot flashes, vaginal dryness, or a lack of sexual desire.  Stop having menstrual periods, or they are irregular or far apart.  Have headaches.  Have vision problems. Get help right away if you:  Have breast discharge that is bloody or pus-like.  Have breast pain.  Feel a lump in your breast.   Have wrinkling or dimpling on your breast.  Notice that your breast becomes red and swollen. Summary  Galactorrhea is an abnormal milky discharge from the breast. The fluid may come from one or both nipples and is often white, yellow, or green.  Galactorrhea may be caused by various things, such as irritation of the nipples, medicines, or changes in hormone levels.  Galactorrhea often goes away without treatment. However, it also may be a sign of something more serious, such as diseases of the kidney or thyroid, or problems with the pituitary gland.  Get help right away if you have discharge that is bloody or pus-like, if you have breast pain or a lump, or if you have skin changes on your breast. This information is not intended to replace advice given to you by your health care provider. Make sure you discuss any questions you have with your health care provider. Document Released: 03/01/2004 Document Revised: 12/19/2016 Document Reviewed: 12/19/2016 Elsevier Interactive Patient Education  2019 Elsevier Inc. Ethinyl Estradiol; Norethindrone Acetate; Ferrous fumarate tablets or capsules What is this medicine? ETHINYL ESTRADIOL; NORETHINDRONE ACETATE; FERROUS FUMARATE (ETH in il es tra DYE ole; nor eth IN drone AS e tate; FER [place] FUE ma rate) is an oral contraceptive. The products combine two types of female hormones, an estrogen and a progestin. They are used to prevent ovulation and pregnancy. Some products are also used to treat acne in females. This medicine may be used for other purposes; ask your health care provider or pharmacist if you have questions. COMMON BRAND NAME(S): Aurovela 24 Fe 1/20, Aurovela Fe, Blisovi  [address] Fe, Blisovi Fe, Estrostep Fe, Gildess [address], Gildess Fe 1.5/30, Gildess Fe 1/20, Hailey 24 Fe, Junel Fe 1.5/30, Junel Fe 1/20, Junel Fe 24, Larin Fe, Lo Loestrin Fe, Loestrin 24 Fe, Loestrin FE 1.5/30, Loestrin FE 1/20, Lomedia 24 Fe, Microgestin 24 Fe, Microgestin Fe 1.5/30,  Microgestin Fe 1/20, Tarina 24 Fe, Tarina Fe 1/20, Taytulla, Tilia Fe, Tri-Legest Fe What should I tell my health care provider before I take this medicine? They need to know if you have any of these conditions: -abnormal vaginal bleeding -blood vessel disease -breast, cervical, endometrial, ovarian, liver, or uterine cancer -diabetes -gallbladder disease -heart disease or recent heart attack -high blood pressure -high cholesterol -history of blood clots -kidney disease -liver disease -migraine headaches -smoke tobacco -stroke -systemic lupus erythematosus (SLE) -an unusual or allergic reaction to estrogens, progestins, other medicines, foods, dyes, or preservatives -pregnant or trying to get pregnant -breast-feeding How should I use this medicine? Take this medicine by mouth. To reduce nausea, this medicine may be taken with food. Follow the directions on the prescription label. Take this medicine at the same time each day and in the order directed on the package. Do not take your medicine more often than directed. A patient package insert for the product will be given with each prescription and refill. Read this sheet carefully each time. The sheet may change frequently. Contact your pediatrician regarding the use of this medicine in children. Special care may be needed. This medicine has been used in female children who have started having menstrual periods. Overdosage: If you think you have taken too much of this medicine contact a poison control center or emergency room at once. NOTE: This medicine is only for you. Do not share this medicine with others. What if I miss a dose? If you miss a dose, refer to the patient information sheet you received with your medicine for direction. If you miss more than one pill, this medicine may not be as effective and you may need to use another form of birth control. What may interact with this medicine? Do not take this medicine with the  following medication: -dasabuvir; ombitasvir; paritaprevir; ritonavir -ombitasvir; paritaprevir; ritonavir This medicine may also interact with the following medications: -acetaminophen -antibiotics or medicines for infections, especially rifampin, rifabutin, rifapentine, and griseofulvin, and possibly penicillins or tetracyclines -aprepitant -ascorbic acid (vitamin C) -atorvastatin -barbiturate medicines, such as phenobarbital -bosentan -carbamazepine -caffeine -clofibrate -cyclosporine -dantrolene -doxercalciferol -felbamate -grapefruit juice -hydrocortisone -medicines for anxiety or sleeping problems, such as diazepam or temazepam -medicines for diabetes, including pioglitazone -mineral oil -modafinil -mycophenolate -nefazodone -oxcarbazepine -phenytoin -prednisolone -ritonavir or other medicines for HIV infection or AIDS -rosuvastatin -selegiline -soy isoflavones supplements -St. John's wort -tamoxifen or raloxifene -theophylline -thyroid hormones -topiramate -warfarin This list may not describe all possible interactions. Give your health care provider a list of all the medicines, herbs, non-prescription drugs, or dietary supplements you use. Also tell them if you smoke, drink alcohol, or use illegal drugs. Some items may interact with your medicine. What should I watch for while using this medicine? Visit your doctor or health care professional for regular checks on your progress. You will need a regular breast and pelvic exam and Pap smear while on this medicine. Use an additional method of contraception during the first cycle that you take these tablets. If you have any reason to think you are pregnant, stop taking this medicine right away and contact your doctor or health care professional. If you are taking this medicine for  hormone related problems, it may take several cycles of use to see improvement in your condition. Smoking increases the risk of getting a  blood clot or having a stroke while you are taking birth control pills, especially if you are more than 24 years old. You are strongly advised not to smoke. This medicine can make your body retain fluid, making your fingers, hands, or ankles swell. Your blood pressure can go up. Contact your doctor or health care professional if you feel you are retaining fluid. This medicine can make you more sensitive to the sun. Keep out of the sun. If you cannot avoid being in the sun, wear protective clothing and use sunscreen. Do not use sun lamps or tanning beds/booths. If you wear contact lenses and notice visual changes, or if the lenses begin to feel uncomfortable, consult your eye care specialist. In some women, tenderness, swelling, or minor bleeding of the gums may occur. Notify your dentist if this happens. Brushing and flossing your teeth regularly may help limit this. See your dentist regularly and inform your dentist of the medicines you are taking. If you are going to have elective surgery, you may need to stop taking this medicine before the surgery. Consult your health care professional for advice. This medicine does not protect you against HIV infection (AIDS) or any other sexually transmitted diseases. What side effects may I notice from receiving this medicine? Side effects that you should report to your doctor or health care professional as soon as possible: -allergic reactions like skin rash, itching or hives, swelling of the face, lips, or tongue -breast tissue changes or discharge -changes in vaginal bleeding during your period or between your periods -changes in vision -chest pain -confusion -coughing up blood -dizziness -feeling faint or lightheaded -headaches or migraines -leg, arm or groin pain -loss of balance or coordination -severe or sudden headaches -stomach pain (severe) -sudden shortness of breath -sudden numbness or weakness of the face, arm or leg -symptoms of vaginal  infection like itching, irritation or unusual discharge -tenderness in the upper abdomen -trouble speaking or understanding -vomiting -yellowing of the eyes or skin Side effects that usually do not require medical attention (report to your doctor or health care professional if they continue or are bothersome): -breakthrough bleeding and spotting that continues beyond the 3 initial cycles of pills -breast tenderness -mood changes, anxiety, depression, frustration, anger, or emotional outbursts -increased sensitivity to sun or ultraviolet light -nausea -skin rash, acne, or brown spots on the skin -weight gain (slight) This list may not describe all possible side effects. Call your doctor for medical advice about side effects. You may report side effects to FDA at 1-800-FDA-1088. Where should I keep my medicine? Keep out of the reach of children. Store at room temperature between 15 and 30 degrees C (59 and 86 degrees F). Throw away any unused medicine after the expiration date. NOTE: This sheet is a summary. It may not cover all possible information. If you have questions about this medicine, talk to your doctor, pharmacist, or health care provider.  2019 Elsevier/Gold Standard (2015-10-03 08:04:41)

## 2018-05-23 ENCOUNTER — Telehealth: Payer: Self-pay

## 2018-05-23 NOTE — Telephone Encounter (Signed)
Coronavirus (COVID-19) Are you at risk?  Are you at risk for the Coronavirus (COVID-19)?  To be considered HIGH RISK for Coronavirus (COVID-19), you have to meet the following criteria:  . Traveled to [place], [place], [place], [place] or [place]; or in the [place] to [place], [place], [place], or [place]; and have fever, cough, and shortness of breath within the last 2 weeks of travel OR . Been in close contact with a person diagnosed with COVID-19 within the last 2 weeks and have fever, cough, and shortness of breath . IF YOU DO NOT MEET THESE CRITERIA, YOU ARE CONSIDERED LOW RISK FOR COVID-19.  What to do if you are HIGH RISK for COVID-19?  . If you are having a medical emergency, call 911. . Seek medical care right away. Before you go to a doctor's office, urgent care or emergency department, call ahead and tell them about your recent travel, contact with someone diagnosed with COVID-19, and your symptoms. You should receive instructions from your physician's office regarding next steps of care.  . When you arrive at healthcare provider, tell the healthcare staff immediately you have returned from visiting [place], [place], [place], [place] or [place]; or traveled in the [place] to [place], [place], [place], or [place]; in the last two weeks or you have been in close contact with a person diagnosed with COVID-19 in the last 2 weeks.   . Tell the health care staff about your symptoms: fever, cough and shortness of breath. . After you have been seen by a medical provider, you will be either: o Tested for (COVID-19) and discharged home on quarantine except to seek medical care if symptoms worsen, and asked to  - Stay home and avoid contact with others until you get your results (4-5 days)  - Avoid travel on public transportation if possible (such as bus, train, or airplane) or o Sent to the Emergency Department by EMS for evaluation, COVID-19 testing, and possible  admission depending on your condition and test results.  What to do if you are LOW RISK for COVID-19?  Reduce your risk of any infection by using the same precautions used for avoiding the common cold or flu:  . Wash your hands often with soap and warm water for at least 20 seconds.  If soap and water are not readily available, use an alcohol-based hand sanitizer with at least 60% alcohol.  . If coughing or sneezing, cover your mouth and nose by coughing or sneezing into the elbow areas of your shirt or coat, into a tissue or into your sleeve (not your hands). . Avoid shaking hands with others and consider head nods or verbal greetings only. . Avoid touching your eyes, nose, or mouth with unwashed hands.  . Avoid close contact with people who are sick. . Avoid places or events with large numbers of people in one location, like concerts or sporting events. . Carefully consider travel plans you have or are making. . If you are planning any travel outside or inside the [place], visit the CDC's Travelers' Health webpage for the latest health notices. . If you have some symptoms but not all symptoms, continue to monitor at home and seek medical attention if your symptoms worsen. . If you are having a medical emergency, call 911.   ADDITIONAL HEALTHCARE OPTIONS FOR PATIENTS  [facility] / e-Visit: [email or web address]         [facility]: [phone]  [facility]: [phone]                   [facility]: [phone]   Prescreened- neg. cm  

## 2018-05-26 ENCOUNTER — Other Ambulatory Visit: Payer: Managed Care, Other (non HMO)

## 2018-05-26 ENCOUNTER — Other Ambulatory Visit: Payer: Self-pay

## 2018-05-26 DIAGNOSIS — N643 Galactorrhea not associated with childbirth: Secondary | ICD-10-CM

## 2018-05-27 ENCOUNTER — Other Ambulatory Visit: Payer: Self-pay | Admitting: Certified Nurse Midwife

## 2018-05-27 DIAGNOSIS — R7989 Other specified abnormal findings of blood chemistry: Secondary | ICD-10-CM

## 2018-05-27 DIAGNOSIS — E229 Hyperfunction of pituitary gland, unspecified: Secondary | ICD-10-CM

## 2018-05-27 DIAGNOSIS — N643 Galactorrhea not associated with childbirth: Secondary | ICD-10-CM

## 2018-05-27 LAB — THYROID PANEL WITH TSH
Free Thyroxine Index: 1.7 (ref 1.2–4.9)
T3 Uptake Ratio: 26 % (ref 24–39)
T4, Total: 6.7 ug/dL (ref 4.5–12.0)
TSH: 1.84 u[IU]/mL (ref 0.450–4.500)

## 2018-05-27 LAB — BETA HCG QUANT (REF LAB): hCG Quant: <1 m[IU]/mL

## 2018-05-27 LAB — PROLACTIN: Prolactin: 25.7 ng/mL — ABNORMAL HIGH (ref 4.8–23.3)

## 2018-05-27 LAB — PROGESTERONE: Progesterone: 2.7 ng/mL

## 2018-08-25 ENCOUNTER — Encounter: Payer: Managed Care, Other (non HMO) | Admitting: Certified Nurse Midwife

## 2019-03-23 ENCOUNTER — Encounter: Payer: Self-pay | Admitting: Certified Nurse Midwife

## 2019-04-10 ENCOUNTER — Encounter: Payer: Self-pay | Admitting: Certified Nurse Midwife

## 2020-04-29 IMAGING — CT CT HEAD W/O CM
3 series · 15 of 46 positions shown, 18 images · non-contrast
Comparison: None.

CLINICAL DATA: Recurrent headaches for 4 months.  Blurred vision.

EXAM:
CT HEAD WITHOUT CONTRAST
TECHNIQUE: Contiguous axial images were obtained from the base of the skull
through the vertex without intravenous contrast.

[Series 2: head wo · axial · 0.47mm/px · z∈[-179,-59]mm · 9 of 29 slices shown, 12 images]
[im 3/29  brain]
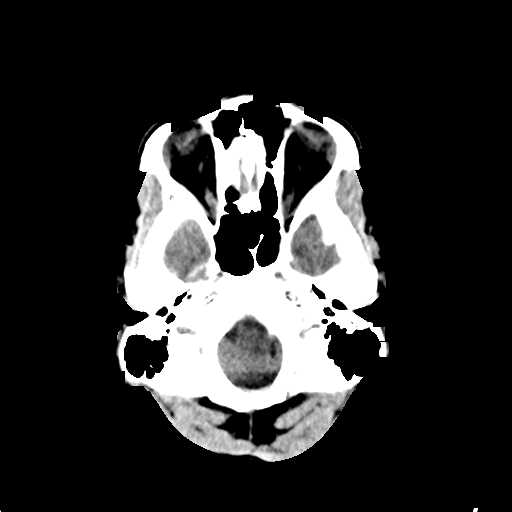
[im 3/29  bone]
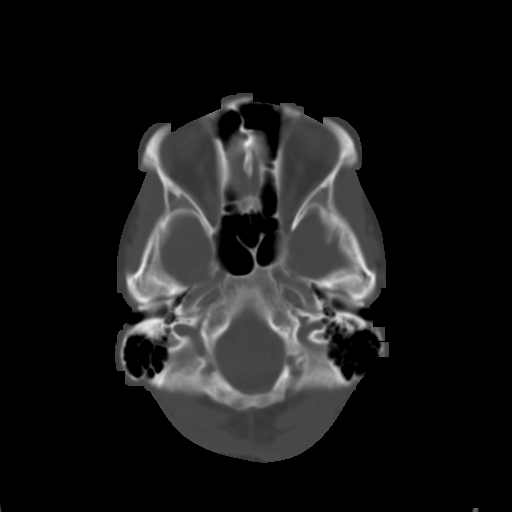
[im 6/29  brain]
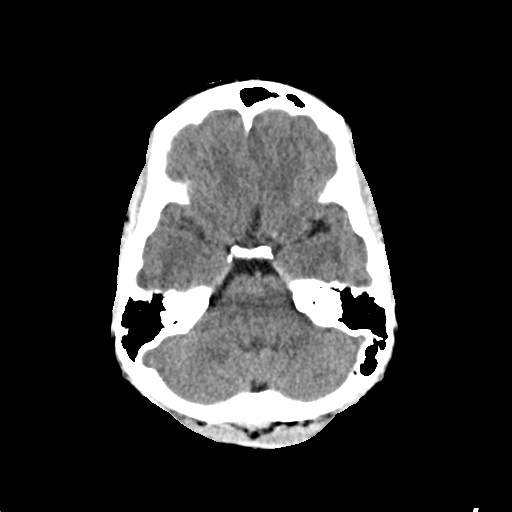
[im 9/29  brain]
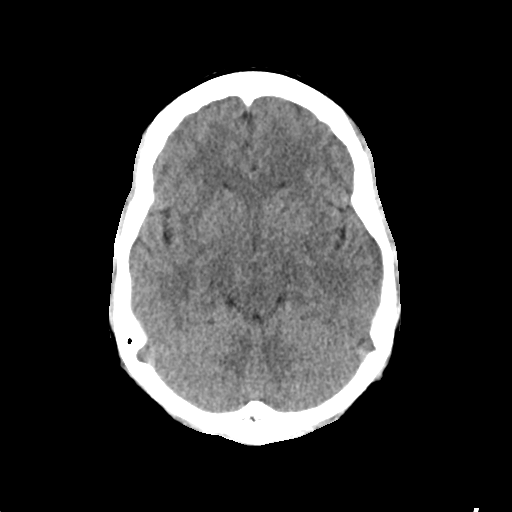
[im 12/29  brain]
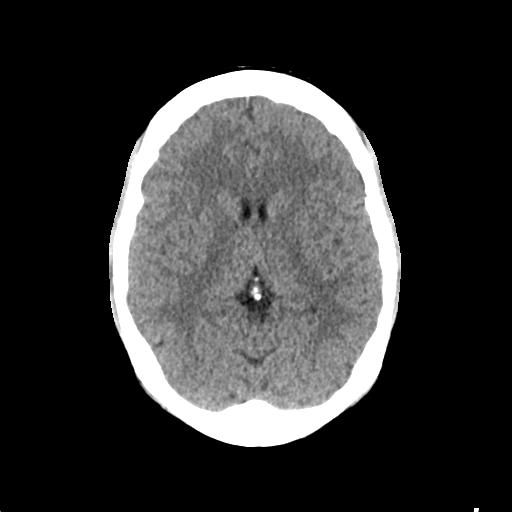
[im 15/29  brain]
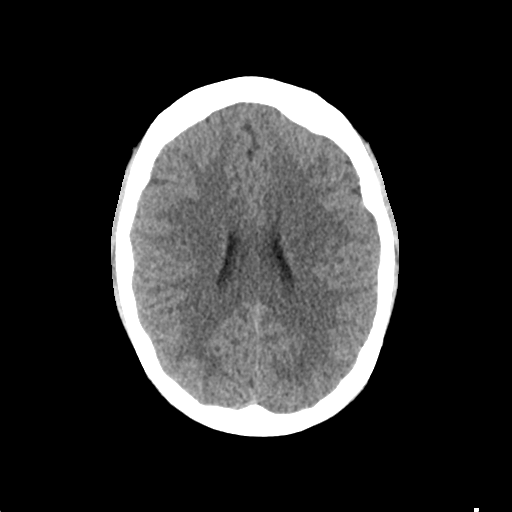
[im 15/29  bone]
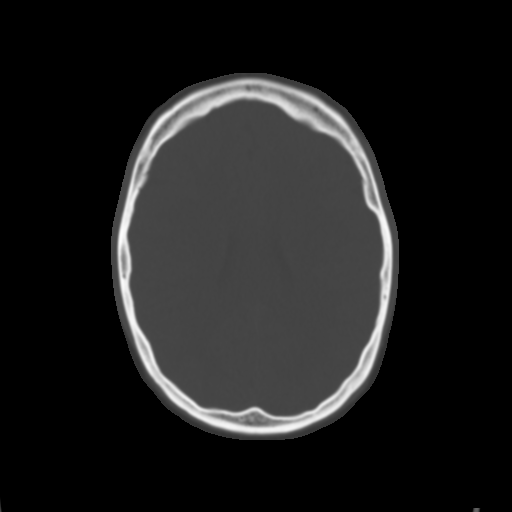
[im 18/29  brain]
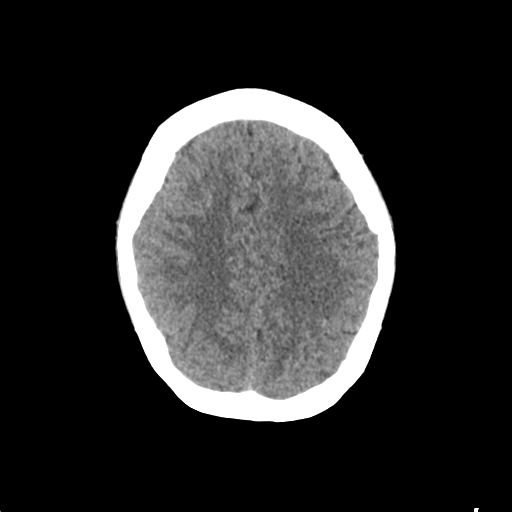
[im 21/29  brain]
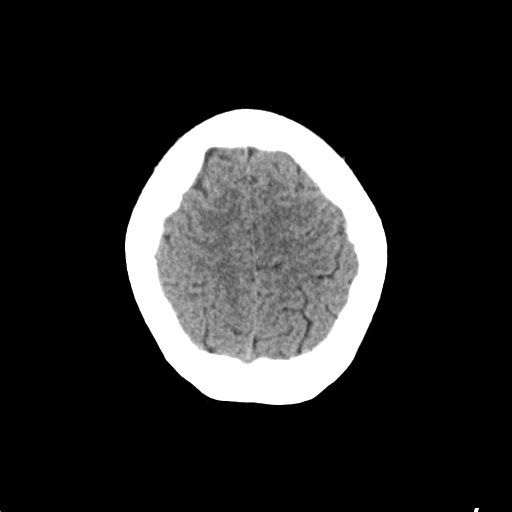
[im 24/29  brain]
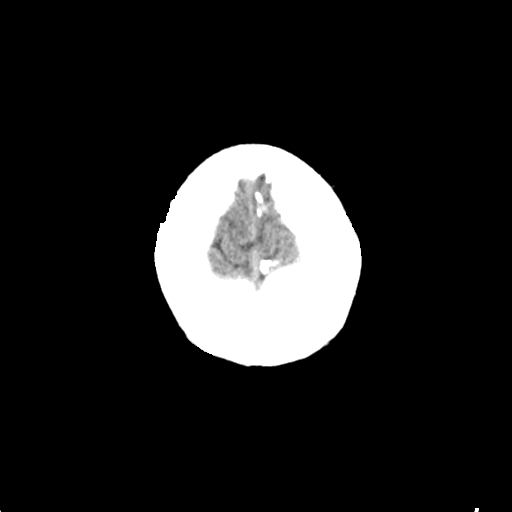
[im 27/29  brain]
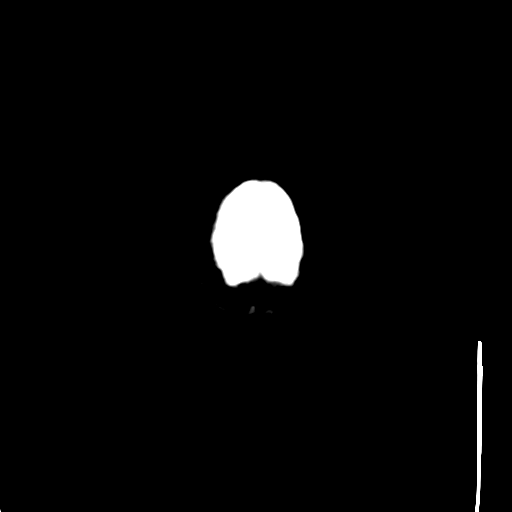
[im 27/29  bone]
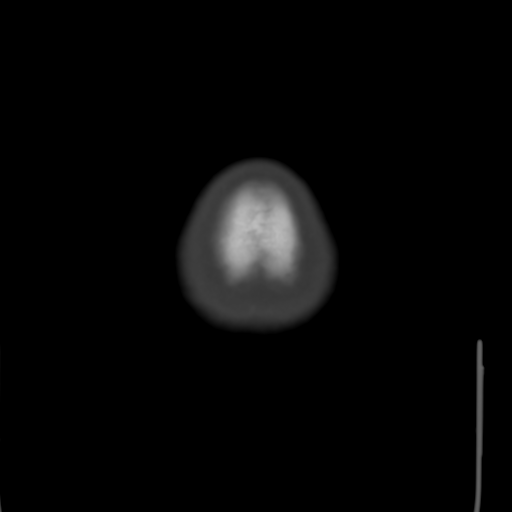

[Series 4: coronal soft tissue · coronal · 0.28mm/px · 3 of 64 slices shown]
[im 22/64  brain]
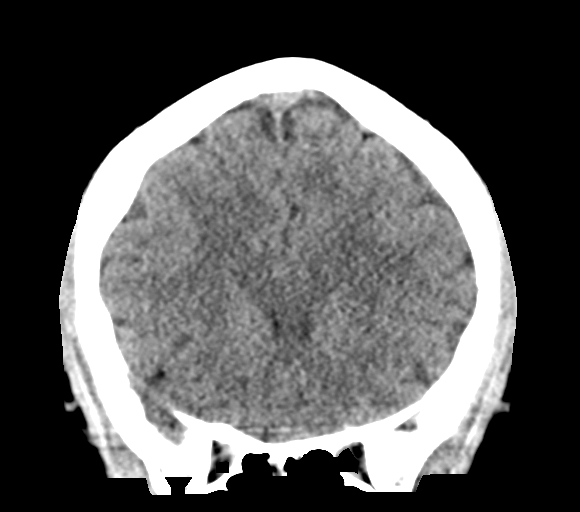
[im 29/64  brain]
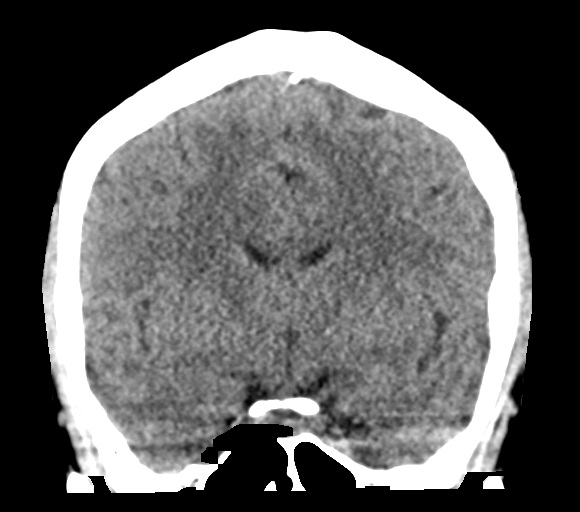
[im 36/64  brain]
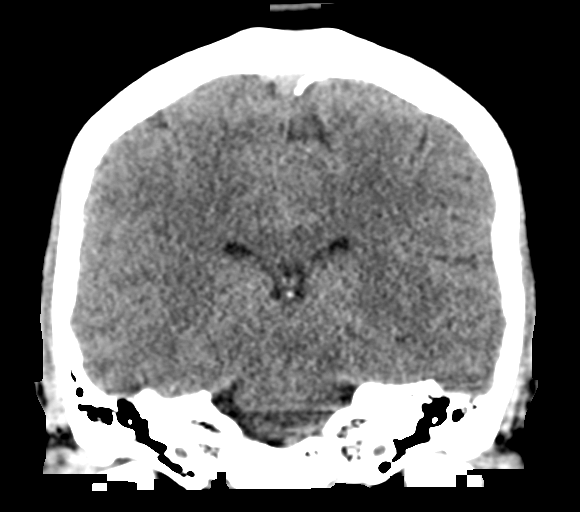

[Series 5: sagittal soft tissue · sagittal · 0.28mm/px · 3 of 53 slices shown]
[im 18/53  brain]
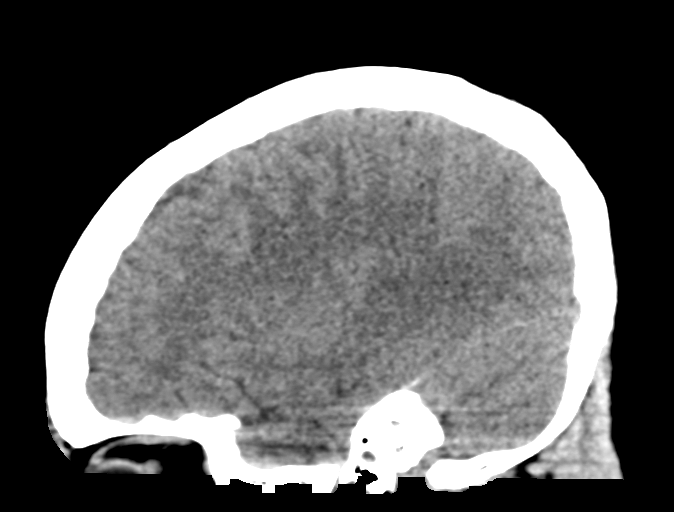
[im 27/53  brain]
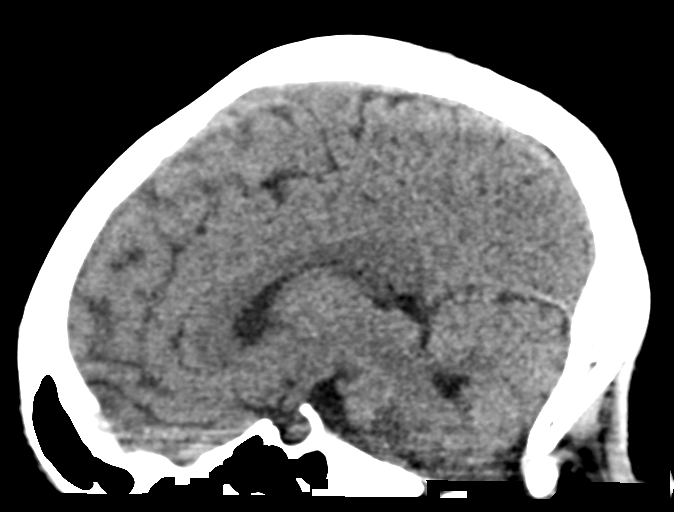
[im 35/53  brain]
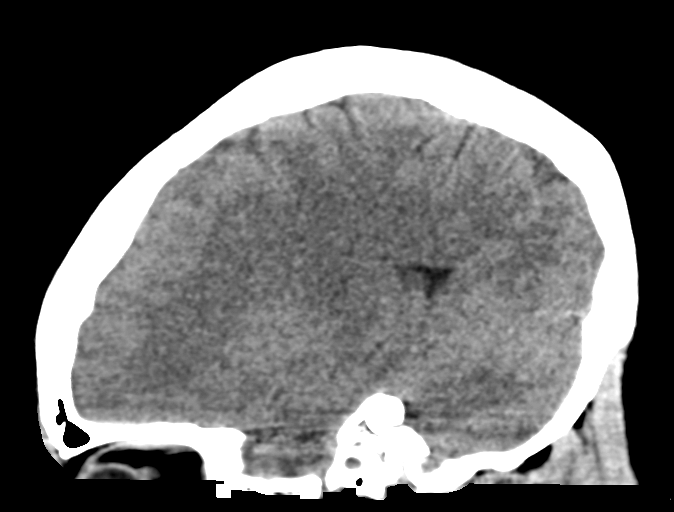

[15 of 46 positions shown; findings below may reference images not displayed]

FINDINGS: Brain: No evidence of acute infarction, hemorrhage, hydrocephalus,
extra-axial collection, or mass lesion/mass effect.

Vascular:  No hyperdense vessel or other acute findings.

Skull: No evidence of fracture or other significant bone
abnormality.

Sinuses/Orbits:  No acute findings.

Other: None.
IMPRESSION: Negative noncontrast head CT.
